# Patient Record
Sex: Male | Born: 1990 | Race: Black or African American | Hispanic: No | Marital: Single | Smoking: Never smoker
Health system: Southern US, Community
[De-identification: ages and names within clinical notes are randomized; demographics above are authoritative.]

---
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
## 2016-02-03 ENCOUNTER — Emergency Department (HOSPITAL_COMMUNITY)
Admission: EM | Admit: 2016-02-03 | Discharge: 2016-02-03 | Disposition: A | Discharge status: Home / Self Care | Payer: Self-pay | Attending: Emergency Medicine | Admitting: Emergency Medicine

## 2016-02-03 ENCOUNTER — Encounter (HOSPITAL_COMMUNITY): Payer: Self-pay | Admitting: Emergency Medicine

## 2016-02-03 DIAGNOSIS — R300 Dysuria: Secondary | ICD-10-CM | POA: Insufficient documentation

## 2016-02-03 DIAGNOSIS — Z202 Contact with and (suspected) exposure to infections with a predominantly sexual mode of transmission: Secondary | ICD-10-CM | POA: Insufficient documentation

## 2016-02-03 DIAGNOSIS — R319 Hematuria, unspecified: Secondary | ICD-10-CM | POA: Insufficient documentation

## 2016-02-03 DIAGNOSIS — R21 Rash and other nonspecific skin eruption: Secondary | ICD-10-CM | POA: Insufficient documentation

## 2016-02-03 DIAGNOSIS — N5089 Other specified disorders of the male genital organs: Secondary | ICD-10-CM | POA: Insufficient documentation

## 2016-02-03 LAB — URINALYSIS, ROUTINE W REFLEX MICROSCOPIC
Bilirubin Urine: NEGATIVE
GLUCOSE, UA: NEGATIVE mg/dL
KETONES UR: NEGATIVE mg/dL
LEUKOCYTES UA: NEGATIVE
Nitrite: NEGATIVE
PH: 7.5 (ref 5.0–8.0)
Protein, ur: NEGATIVE mg/dL
Specific Gravity, Urine: 1.008 (ref 1.005–1.030)

## 2016-02-03 LAB — BASIC METABOLIC PANEL
ANION GAP: 6 (ref 5–15)
BUN: 10 mg/dL (ref 6–20)
CALCIUM: 9.6 mg/dL (ref 8.9–10.3)
CO2: 26 mmol/L (ref 22–32)
Chloride: 106 mmol/L (ref 101–111)
Creatinine, Ser: 0.84 mg/dL (ref 0.61–1.24)
GFR calc Af Amer: >60 mL/min (ref >60)
GFR calc non Af Amer: >60 mL/min (ref >60)
GLUCOSE: 91 mg/dL (ref 65–99)
POTASSIUM: 4 mmol/L (ref 3.5–5.1)
Sodium: 138 mmol/L (ref 135–145)

## 2016-02-03 LAB — URINE MICROSCOPIC-ADD ON
BACTERIA UA: NONE SEEN
Squamous Epithelial / LPF: NONE SEEN
WBC, UA: NONE SEEN WBC/hpf (ref 0–5)

## 2016-02-03 MED ORDER — LIDOCAINE HCL 1 % IJ SOLN
INTRAMUSCULAR | Status: AC
  Administered 2016-02-03: 0.5 mL
  Filled 2016-02-03: qty 20

## 2016-02-03 MED ORDER — AZITHROMYCIN 250 MG PO TABS
1000.0000 mg | ORAL_TABLET | Freq: Once | ORAL | Status: AC
  Administered 2016-02-03: 1000 mg via ORAL
  Filled 2016-02-03: qty 4

## 2016-02-03 MED ORDER — CEFTRIAXONE SODIUM 250 MG IJ SOLR
250.0000 mg | Freq: Once | INTRAMUSCULAR | Status: AC
Start: 2016-02-03 — End: 2016-02-03
  Administered 2016-02-03: 250 mg via INTRAMUSCULAR
  Filled 2016-02-03: qty 250

## 2016-02-03 NOTE — ED Notes (Signed)
Seen at [facility] sending patient here for left lower abdominal pain related to a STD-states symptoms of discharge and lesions on genitalia for a year-abdominal symptoms increased last couple of days

## 2016-02-03 NOTE — ED Provider Notes (Signed)
[facility]-EMERGENCY DEPT Provider Note   CSN: [id number] Arrival date & time: 02/03/16  1100  By signing my name below, I, [name], attest that this documentation has been prepared under the direction and in the presence of non-physician practitioner, Arvilla MeresAshley Meyer, PA-C. Electronically Signed: [name], Scribe. 02/03/2016. 12:05 PM.   History   Chief Complaint Chief Complaint  Patient presents with  . Exposure to STD   The history is provided by the patient. No language interpreter was used.     HPI Comments:  Derek NunneryReynaldo Mendoza is a 25 y.o. male who presents to the Emergency Department complaining of sudden-onset intermittent penile discharge, onset yesterday. Pt is requesting an STD check. He reports he has not been sexually active with a male partner in the past year, but does not recall using protection during his last sexual encounter.  Pt notes that he has had similar symptoms  before, but he has never gotten checked. He reports associated GU rash described as small bumps - no ulcerations or vesicles. He also reports he has had intermittent scrotal swelling since he was a child that he describes as a "heaviness" with "lumps" in scrotum; patient denies these symptoms today.  He also notes associated dysuria and hematuria that have occurred in the past, but are not occurring today. He denies any fever, nausea, vomiting, abdominal pain, or testicular swelling. No modifying factors indicated.   History reviewed. No pertinent past medical history.  There are no active problems to display for this patient.   History reviewed. No pertinent surgical history.     Home Medications    Prior to Admission medications   Not on File    Family History No family history on file.  Social History Social History  Substance Use Topics  . Smoking status: Never Smoker  . Smokeless tobacco: Never Used  . Alcohol use Yes     Allergies   Review of patient's allergies indicates  not on file.   Review of Systems Review of Systems  Constitutional: Negative for fever.  Gastrointestinal: Negative for abdominal pain, nausea, rectal pain and vomiting.  Genitourinary: Positive for discharge, dysuria ( intermittent, not current), hematuria ( intermittent, not currently) and scrotal swelling ( intermittent, not currently). Negative for penile pain, penile swelling and testicular pain.  Skin: Positive for rash ( small bumps on penis).     Physical Exam Updated Vital Signs BP 135/80 (BP Location: Right Arm)   Pulse (!) 51   Temp 97.8 F (36.6 C) (Oral)   Resp 16   SpO2 100%   Physical Exam  Constitutional: He appears well-developed and well-nourished. No distress.  HENT:  Head: Normocephalic and atraumatic.  Eyes: Conjunctivae and EOM are normal. Right eye exhibits no discharge. Left eye exhibits no discharge. No scleral icterus.  Neck: Normal range of motion.  Cardiovascular: Normal rate and intact distal pulses.   Pulmonary/Chest: Effort normal.  Abdominal: Soft. Bowel sounds are normal. He exhibits no distension and no mass. There is no tenderness.  Genitourinary: Testes normal and penis normal. Right testis shows no mass, no swelling and no tenderness. Left testis shows no mass, no swelling and no tenderness. Circumcised. No penile tenderness. No discharge found.  Genitourinary Comments: Chaperone present for duration of exam. Circumcised penis. No vesicles, lesions, or ulcerations appreciated. No scrotal swelling or masses noted. No high riding testes. No TTP of testes or penis. No penile discharge.   Musculoskeletal: Normal range of motion.  Neurological: He is alert. He is not  disoriented. Coordination and gait normal. GCS eye subscore is 4. GCS verbal subscore is 5. GCS motor subscore is 6.  Skin: Skin is warm and dry. He is not diaphoretic.  Psychiatric: He has a normal mood and affect. His behavior is normal.  Nursing note and vitals reviewed.    ED  Treatments / Results  DIAGNOSTIC STUDIES:  Oxygen Saturation is 100% on RA, normal by my interpretation.    COORDINATION OF CARE:  12:18 PM Discussed treatment plan with pt at bedside which included urinalysis and blood work and pt agreed to plan.  Labs (all labs ordered are listed, but only abnormal results are displayed) Labs Reviewed  URINALYSIS, ROUTINE W REFLEX MICROSCOPIC (NOT AT [facility]) - Abnormal; Notable for the following:       Result Value   Hgb urine dipstick TRACE (*)    All other components within normal limits  HIV ANTIBODY (ROUTINE TESTING)  RPR  BASIC METABOLIC PANEL  URINE MICROSCOPIC-ADD ON  GC/CHLAMYDIA PROBE AMP ([place]) NOT AT [facility]    EKG  EKG Interpretation None       Radiology No results found.  Procedures Procedures (including critical care time)  Medications Ordered in ED Medications  cefTRIAXone (ROCEPHIN) injection 250 mg (250 mg Intramuscular Given 02/03/16 1251)  azithromycin (ZITHROMAX) tablet 1,000 mg (1,000 mg Oral Given 02/03/16 1251)  lidocaine (XYLOCAINE) 1 % (with pres) injection (0.5 mLs  Given 02/03/16 1252)     Initial Impression / Assessment and Plan / ED Course  I have reviewed the triage vital signs and the nursing notes.  Pertinent labs & imaging results that were available during my care of the patient were reviewed by me and considered in my medical decision making (see chart for details).  Clinical Course    Patient presents to ED with complaint of penile discharge. Patient is afebrile and non-toxic appearing in NAD. GU exam shows normal circumcised penis without vesicles, lesions, or ulcerations, no scrotal swelling, masses or pain palpated, no high riding testes, no penile pain or discharge. U/A negative for infection. No concern for prostatitis or epididymitis. GC/chlamydia/RPR/HIV pending. Patient treated in the ED for STI with IM ceftriaxone and PO azithromycin. Patient advised to inform and treat all sexual  partners. Pt encouraged to follow up at local health department for future STI checks. Provided resources for establishing a PCP, provided resources for local providers - can follow up OP regarding intermittent scrotal swelling. Discussed return precautions. Pt appears safe for discharge.   I personally performed the services described in this documentation, which was scribed in my presence. The recorded information has been reviewed and is accurate.  Final Clinical Impressions(s) / ED Diagnoses   Final diagnoses:  Possible exposure to STD    New Prescriptions There are no discharge medications for this patient.     Lona Kettle, PA-C 02/04/16 1731    Rolland Porter, MD 02/13/16 [phone]

## 2016-02-03 NOTE — Discharge Instructions (Signed)
Read the information below.   Your urine did not show any signs of infection.  The gonorrhea, chlamydia, syphilis, and HIV will take a few days to get back, you will be notified if anything is abnormal. You were treated today for possible STI. Be sure to notify any recent sexual partners of your treatment so that they can be tested and treated.  I [facility] for establishing a primary care provider. I have also provided additional resources as well for other local providers. Please call to schedule a follow up appointment. You can also go to the Health Department for future STI testing.  You may return to the Emergency Department at any time for worsening condition or any new symptoms that concern you. Return to ED if you have any new or worsening symptoms such as fever, abdominal pain, vomiting, inability to keep fluids down, blood in urine, blood in stool, constant scrotal painswelling.

## 2016-02-03 NOTE — Progress Notes (Addendum)
Pt stated he has been having greyish penile discharge since yesterday . He stated he has had this in the past and never received treatment.Pt admits to practicing unprotective sex. Pt s/p blood draw. Instructed pt we need a urine. (12:20pm)

## 2016-02-03 NOTE — ED Triage Notes (Signed)
Pt presents to ED wanting to be checked for STD. Pt also sts "Well my aunt told me to tell you everything while I am here. I had a pain in my R side a month ago. Haven't had it for awhile now. And every now and then I get a pins and needles feeling in my legs." Pt denies feeling at this time. A&Ox4 and ambulatory.

## 2016-02-04 LAB — RPR: RPR: NONREACTIVE

## 2016-02-04 LAB — HIV ANTIBODY (ROUTINE TESTING W REFLEX): HIV Screen 4th Generation wRfx: NONREACTIVE

## 2016-04-12 ENCOUNTER — Telehealth (HOSPITAL_COMMUNITY): Payer: Self-pay

## 2016-04-12 NOTE — Telephone Encounter (Signed)
Pt calling for STD results.  ID verified x 2.  Pt informed both Gonorrhea and Chlamydia are negative and HIV and RPR are non reactive.
